# Patient Record
Sex: Male | Born: 1980 | Hispanic: Yes | Marital: Married
Health system: Western US, Academic
[De-identification: ages and names within clinical notes are randomized; demographics above are authoritative.]

---

## 2021-02-27 ENCOUNTER — Inpatient Hospital Stay: Admit: 2021-10-10 | Payer: BLUE CROSS/BLUE SHIELD | Primary: Physician

## 2021-04-06 ENCOUNTER — Inpatient Hospital Stay: Admit: 2021-10-10 | Payer: BLUE CROSS/BLUE SHIELD | Primary: Physician

## 2021-05-14 NOTE — Progress Notes (Unsigned)
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    Of note, patient saw his Dr. 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        Evaluation of a 41 y.o., {Right/left:16020}-handed male who presents for {bndiagnosis:39523}. The current symptoms first started ***/***/***. Date of initial diagnosis of Neuralgic Amyotrophy (Brachial Neuritis )***/***/***. Time from symptom onset to the date of Neuralgic Amyotrophy (Brachial Neuritis) diagnosis: ***days/weeks/months. Age at diagnosis: ***. The first symptoms were {BN First Symptom:39514}. At the onset of symptoms, the limbs involved were {BNLimb involvement:39524}. Antecedent events include {BNantecedent events:39525}. Time of day of onset was: {BN time of onse:41269}. At maximal deficit it involved {BNLimb involvement:39524}. Maximal deficit {BN max symptom:41762} was achieved in {BN max def OVFI:43329}. Motor symptoms included ***. Sensory symptoms included ***. Respiratory symptoms include: {BN respiratory sx:39896}. The pain began to improve in {BN max def JJOA:41660}. Current pain locations are: {BN location UE:39676}. The patient's current pain score is {pain score:304107553} and is located at the {BN Pain Location:39516}. Pain range in the last week: {BN pain range YTKZ:60109}.    The weakness began to improve in {BN max def NATF:57322}.The patient achieved full/partial*** recovery*** within {BN recovery time:39674}. The patient was ***able/unable to work for [employer] recovery [id number]. The patient's occupation is ***. The patient was able to work at ***%.    Previous history of brachial neuritis:  The first diagnosis of neuralgic amyotrophy (brachial neuritis) was made ***/***/***. This has occurred *** time(s). Time  from symptom onset to the date of Neuralgic Amyotrophy (Brachial Neuritis) diagnosis was: {BN time of onse:41269}. The age at diagnosis: ***. The first symptoms were {BN First Symptom:39514}. At the onset of symptoms, the limbs involved were {BNLimb involvement:39524}. Antecedent events include {BNantecedent events:39525}. Time of day of onset was: {BN time of onse:41269}. At maximal deficit it involved {BNLimb involvement:39524}. Maximal deficit {BN max symptom:41762} was achieved in {BN max def GURK:27062}. Motor symptoms included ***. Sensory symptoms included ***. Respiratory symptoms included: {BN respiratory sx:39896}. The pain began to improve in {BN max def BJSE:83151}. The weakness began to improve in {BN max def VOHY:07371}.The patient achieved full/partial*** recovery*** within {BN recovery time:39674}. The patient was ***able/unable to work for [employer] [id number]. The patient's occupation was ***. The patient was able to work at ***%.    The patient has seen *** clinicians for these symptoms. Specialty include: ***. Diagnoses given prior to the diagnosis of neuralgic amyotrophy (brachial neuritis) include: injury of brachial plexus, cervicalgia, other muscle spasm. The time from symptom onset to physical therapy: ***. It was completed: {Yes or No:22831}. It was helpful: {Yes or No:22831}. Time from symptom onset to occupational therapy: ***. It was completed: {Yes or No:22831}. It was helpful: {Yes or No:22831}.    Family History of neuralgic amyotrophy (brachial neuritis)?{BN GGYI:94854}    AN (BN )Treatment Medication Name   Start (MM/DD/YYYY, MM/YYYY, YYYY, Unknown) End (MM/DD/YYYY, MM/YYYY, YYYY, Unknown) Ongoing          []          []        []       MEDICATIONS      Review of Systems:    Constitutional: {Findings; ROS constitutional:30497::"negative"}  Eyes: {Findings;  ROS eyes:30500::"negative"}  Ears, nose, mouth, throat, and face: {Findings; ROS HEENT:30502::"negative"}  Respiratory:  {Findings; ROS respiratory:30504::"negative"}  Cardiovascular: {Findings; ROS cardiac:30506::"negative"}  Gastrointestinal: {Findings; ROS gastrointestinal:30513::"negative"}  Genitourinary: {Findings; ROS genitourinary:30516::"negative"}  Hematologic/lymphatic: {Findings; ROS hematologic/lymphatic:30522::"negative"}  Musculoskeletal: {Findings; ROS musculoskeletal:30524::"negative"}  Neurological: {Findings; ROS neuro:30532::"negative"}  Behavioral/Psych: {Findings; ROS psychiatric:30534::"negative"}  Endocrine: {Findings; ROS endocrine:30536::"negative"}  Allergic/Immunologic: {Findings; ROS allergic/immunologic:30539::"negative"}  ***     Medical and Surgical History:   Past Medical History:   Diagnosis Date    Hip pain        Past Surgical History:   Procedure Laterality Date    2 knee surgeries  2012,2009       Allergies:   Allergies/Contraindications  No Known Allergies    Social History:   Social History     Socioeconomic History    Marital status: Married     Spouse name: Not on file    Number of children: Not on file    Years of education: Not on file    Highest education level: Not on file   Occupational History    Not on file   Tobacco Use    Smoking status: Never    Smokeless tobacco: Never   Substance and Sexual Activity    Alcohol use: No    Drug use: Yes     Types: Anabolic steroids    Sexual activity: Not on file   Other Topics Concern    Not on file   Social History Narrative    Not on file     Social Determinants of Health     Financial Resource Strain: Not on file   Food Insecurity: Not on file   Transportation Needs: Not on file       Family History: No family history on file.        Objective            Lungs:  Clear to auscultation  Cardiac:  Regular rate and rhythm without gallops, rubs or murmurs.  Distal pulses normal  Musculoskeletal:  Normal range of motion of neck, shoulders, elbows, wrists, hips, knees and ankles.      NEUROLOGICAL EXAM    Lungs:  Clear to  auscultation  Cardiac:  Regular rate and rhythm without gallops, rubs or murmurs.  Distal pulses normal  Musculoskeletal:  Normal range of motion of neck, shoulders, elbows, wrists, hips, knees and ankles.    Mental Status:  The patient is alert and oriented X 3, is a clear historian and is fluent with normal comprehension.    Cranial Nerves:   Right Left   EOM Full Full   Fundus Normal with sharp disc Normal with sharp disc   Visual fields Full to confrontation Full to confrontation   Pupils PERRL PERRL   Ptosis No No   Nystagmus No No   Facial sensation to light touch V1, V2, V3 Normal Normal   Facial nerve       Frontalis 5/5 5/5     Oribcularis oculi 5/5 5/5     Orbicularis oris 5/5 5/5   Hearing to finger rub Normal Normal   Speech Normal Normal    Ma Normal Normal    La Normal Normal    Ga Normal Normal   SCM 5/5 5/5   Tongue       Strength 5/5 5/5     AMR Normal Normal     Bulk Normal Normal    Fasciculations None None     Muscle atrophy: {  BN atrophy:39702}  Muscle Weakness: {BN weakness:39703}    Motor Strength:  Muscle Deltoid Biceps Triceps Infra-  spin Supra-  spin Pectoralis   Right 5 5 5 5 5 5    Left 5 5 5 5 5 5     Wrist extend FCR FCU Pron T. Sup Subscap   Right 5 5 5 5 5 5    Left 5 5 5 5 5 5     Finger extend EIP FDP 2/3 FDP 4/5 Brachio-  radialis Trapezius   Right 5 5 5 5 5 5    Left 5 5 5 5 5 5     ADM FDI APB FPL Pron Q. Rhomboid   Right 5 5 5 5 5 5    Left 5 5 5 5 5 5     Ilio-  psoas Gluteus max Quad Ham     Right 5 5 5 5      Left 5 5 5 5       Dorsi-  flex Plantar-  flex Toe ext Toe flex Inversion Eversion   Right 5 5 5 5 5 5    Left 5 5 5 5 5 5       Scapular winging: {BN Scap wing:39704}   Scapular dyskinesia: {BN scap dys:39894}   Sensory loss: {BN sensory less:39705}     DTR:   Right Left   Jaw     Biceps 2 2   Triceps 2 2   Brachioradialis 2 2   Finger flexors     Knee 2 2   Int. Hamstring     Ankle 2 2   Babinski Normal Normal     Involvement of motor nerves: {BN nerves :39706}     Gait:   walks on toes and heels, normal base, stride and turn, normal tandem. Romberg negative.       Coordination:  normal finger and foot tap, finger-nose and heel-knee-shin.     Sensory examination:  Normal vibration, position sense and pinprick in the upper and lower extremities.            Review of Prior Testing        TESTS PERFORMED  1. No EMG performed  2. Chest x-ray: Yes  2.1. If yes, Hemidiaphragm: No , N/A  2.2. Date of x-ray: 02/27/2021    3. N/A  4. Nerve ultrasound No  If yes, part of body?  N/A  5. Has the patient had genetic testing?  No, genetic testing was not performed.   6. Other tests:   6.1. Xray of cervical spine: 02/27/2021  ?         PULMONARY FUNCTION TESTS  1. Was Pulmonary Function Testing performed since their last visit (including today's visit)? No     What type of pulmonary function testing has been performed since last visit?  N/A      Assessment and Plan         {Assessment and plan options:32133}        The patient will follow-up in the neurology clinic in *** months, but was encouraged to call or return to clinic sooner than scheduled if symptoms worsen or new symptoms develop.          {Complexity of data reviewed (optional):28233::" "}    I spent a total of *** minutes on this patient's care on the day of their visit excluding time spent related to any billed procedures. This time includes time spent with the patient as well as time spent documenting in the medical record, reviewing  patient's records and tests, obtaining history, placing orders, communicating with other healthcare professionals, counseling the patient, family, or caregiver, and/or care coordination for the diagnoses above.      I, Ellard Artis am acting as a Neurosurgeon for services provided by Riley Lam. Poncelet, MD on 05/07/21  8:01 PM.    I, Cristel Allen am acting as a Neurosurgeon for services provided by Riley Lam. Poncelet, MD on 05/12/21  3:07 PM.    ***

## 2021-10-08 ENCOUNTER — Ambulatory Visit: Admit: 2021-10-08 | Discharge: 2021-10-08 | Payer: 59 | Attending: Neurology | Primary: Physician

## 2021-10-08 DIAGNOSIS — G545 Neuralgic amyotrophy: Secondary | ICD-10-CM

## 2021-10-08 MED ORDER — ALBUTEROL SULFATE HFA 90 MCG/ACTUATION AEROSOL INHALER
90 | RESPIRATORY_TRACT | 2.00 refills | 25.00000 days | Status: AC | PRN
Start: 2021-10-08 — End: ?

## 2021-10-08 NOTE — Patient Instructions (Addendum)
Thank you for visiting the [facility]. You were seen by Dr. Pearline Cables.    The diagnosis discussed today: Neuralgic amyotrophy of right brachial plexus, and Scapular dyskinesis    Diagnostic testing discussed today: - MR neurogram - To schedule your exam, call [facility] Scheduling [phone].    You can access your results via MyChart. However, if you would like to discuss results with me, please let me know via MyChart or telephone.  MyChart messages sent during business hours (8am - 5pm) W, Th or Fri should be responded to within 24 - 72 hours. After hours messages over the weekend messages will likely receive a response the following Monday.     The my recommendations for you today:   - [facility]erral to [facility]  - Referral to physical therapy - Kizzie Fantasia, Charlsie Quest or [facility] Palms- To schedule physical therapy, please call [phone]  - Consider EMG/NCS    How do I contact my  neurologist, Dr. Pearline Cables?    For appointments and updates regarding future tests and referrals:    Send a MyChart message  Call the clinic at [phone]  Fax [phone]  Mailing address: [address], [address], [place], [place] [address]    For non-urgent questions:    Send a message through the MyChart system, OR  Call the clinic at [phone]    For urgent matters:    Call our hospital at [phone] and ask the operator to page the neurology resident on-call.    For emergencies:    If you cannot get a quick response, please call 911 or go to your local emergency room.
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
## 2021-12-31 ENCOUNTER — Ambulatory Visit: Payer: PRIVATE HEALTH INSURANCE | Attending: Neurology | Primary: Physician

## 2022-02-04 ENCOUNTER — Ambulatory Visit: Admit: 2022-02-04 | Payer: 59 | Attending: Neurology | Primary: Physician

## 2022-02-04 DIAGNOSIS — G545 Neuralgic amyotrophy: Secondary | ICD-10-CM
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
## 2022-02-04 NOTE — Telephone Encounter (Signed)
CIS20R Fatigue Scale  Higher scores correlate with greater fatigue/difficulty on all measures    Total Score: 52  Score range: 20 - 140    Sub-scales  1. Subjective feeling of fatigue - 23  Score range: 8 - 56  2. Concentration - 16  Score range: 5 - 35   3. Motivation  - 8  Score range: 4 - 28  4. Physical activity - 5  Score range: 3 - 21    Dominant Arm: R  For which shoulder(s) have you been evaluated or treated? R    Considering all the ways that your shoulder affects you, mark X on the scale below for how well you are doing.    0 - 20 7.5    During the past month, how would you describe the usual pain in your shoulder at rest?  Very severe  Severe  Moderate  Mild  None  During the past month, how would you describe the usual pain in your shoulder during activities?  Very severe  Severe  Moderate  Mild  None  4.  During the past month, how often did the pain in your shoulder make it difficult for you to sleep at night?   Every day  Several days per week  One day per week  Less than one day per week  Never  5. During the past month how often have you had severe pain in your shoulder?  Every day  Several days per week  One day per week  Less than one day per week  Never  6. Considering all the ways you use your shoulder during daily, personal and household activities, (ie dressing, washing, driving, house chores, etc.) how would you describe your ability to use your shoulder?  Very severe limitation/unable  Severe limitation  Moderate limitation  Mild limitation  No limitation    During the past month, how much difficulty have you had in each of the following activities due to your shoulder.    7. Putting on or removing a pullover sweater or shirt.  Unable  Severe difficulty  Moderate difficulty  Mild difficulty  No difficulty  8. Combing or brushing your hair  Unable  Severe difficulty  Moderate difficulty  Mild difficulty  No difficulty  9. Reaching shelves that are above your head  Unable  Severe  difficulty  Moderate difficulty  Mild difficulty  No difficulty  10. Scratching or washing your low back with your hand.  Unable  Severe difficulty  Moderate difficulty  Mild difficulty  No difficulty  11. Lifting or carrying a full bag of groceries (8 to 10 pounds)  Unable  Severe difficulty  Moderate difficulty  Mild difficulty  No difficulty    The following questions refer to athletic or recreational activities    12. Considering all the ways you use your shoulder during athletic or recreational activities (ie baseball, golf, aerobics, gardening, etc.) how would you describe the function of your shoulder?  Very severe limitation/unable  Severe limitation  Moderate limitation  Mild limitation  No limitation  13. During the past month, how much difficulty have you had throwing a ball overhand or serving in tennis due to your shoulder.  Unable  Severe difficulty  Moderate difficulty  Mild difficulty  No difficulty  14. List one activity (recreational or athletic) that you particularly enjoy, then select the degree of limitation you have, if any, due to your shoulder.         Activity:  [employer], wrestling (BJJ), and weight lifting_________  Unable  Severe difficulty  Moderate difficulty  Mild difficulty  No difficulty  The following questions refer to work.  15. During the past month what has been your main form of work?  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 During the past month how often were you unable to do any of your usual housework because of your shoulder?  Every day  Several days per week  One day per week  Less than one day per week  Never  17. During the past month on the days that you did work, how often were you unable to do your work as carefully or as efficiently as you would like?  Every day  Several days per week  One day per week  Less than one day per  week  Never  18. During the past month, on the days that you did work, how often did you have to work a shorter day because of your shoulder?  Every day  Several days per week  One day per week  Less than one day per week  Never  19. During the past month, on the days that you did work, how often did you have to change the way that your usual work is done because of your shoulder?  Every day  Several days per week  One day per week  Less than one day per week  Never    The following questions refer to satisfaction and areas for improvement.    20. During the past month, how would you rate your overall degree of satisfaction with your shoulder?  Poor  Fair  Good  Very good  Excellent  Please rank the two areas in which you would most like to see improvement (place a "1" for the most important a "2" for the second most important)  Pain 1  Daily personal and household activities 2  Recreational or athletic activities 1  Work  1    QuickDASH  Each item is a Likert scale from 1-5. Larger scores indicate greater difficulty.    Ability to do the following activities in the last week    1. Open a tight or new jar. - 4  2. Do heavy household chores (e.g., wash walls, floors). - 2  3. Carry a shopping bag or briefcase. - 2  4. Wash your back. - 1  5. Use a knife to cut food. - 2  6. Recreational activities in which you take some force or impact through your arm, shoulder or hand (e.g., golf, hammering, tennis, etc.). - 4      7. During the past week, to what extent has your arm, shoulder or hand problem interfered with your normal social activities with family, friends, neighbours or groups? - 4    8. During the past week, were you limited in your work or other regular daily activities as a result of your arm, shoulder or hand problem? - 4    Please rate the severity of the following symptoms in the last week  9. Arm, shoulder or hand pain - 3  10. Tingling (pins and needles) in your arm, shoulder or hand - 4    11. During  the past week, how much difficulty have you had sleeping because of the pain in your arm,  shoulder or hand? - 3    QuickDASH DISABILITY/SYMPTOM SCORE: 33  Total score out of 100 - larger score indicates larger disability    Work Module  The following questions ask about the impact of your arm, shoulder or hand problem on your ability to work (including  homemaking if that is your main work role).  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 using your usual technique for your work? - 4  2. doing your usual work because of arm,  shoulder or hand pain? - 4  3. doing your work as well as you would like? - 4  4. spending your usual amount of time doing your work? - 4    Work Score: 16  Total score out of 100 - larger score indicates larger disability    Sports/Performing Arts Module  The following questions relate to the impact of your arm, shoulder or hand problem on playing your musical instrument or  sport or both. If you play more than one sport or instrument (or play both), please answer with respect to that activity which is  most important to you.  Please indicate the sport or instrument which is most important to you:____BJJ / wrestling__________    1. using your usual technique for playing your instrument or sport? - 4  2. playing your musical instrument or sport because of arm, shoulder or hand pain? - 4  3. playing your musical instrument or sport  as well as you would like? - 4  4. spending your usual amount of time  practising or playing your instrument or sport? - 4    Sports/Performing Arts Score: 16  Total score out of 100 - larger score indicates larger disability     Consent: Consented to [facility] registry and NID study, no photo or videos were taken per request of Dr. Jeannene Patella

## 2022-02-04 NOTE — Progress Notes (Signed)
PT Consultation at the [facility]    S:   Chief Complaint:  R UE weakness  HPI: First injured in 2019 during exposition, resulted in whole R upper quadrant weakness, atrophy and paresthesia, resolved for the most part by working with trainer;  then in 12/2020, had sudden onset of neck and arm pain and increase in weakness in shoulder and bicep/triceps.  Since that time has continued neck, R lateral neck pain that radiates to the lateral forearm on the R, as well as continued with weakness of the R shoulder and arm that also includes R grip strength.  His main concern is his strength but also notes that pain in the neck is worsened by driving, sitting on bleachers and chin tuck.  Has not been seeing PT this most recent episode as he feels that they have not yet been helpful.    Functional limitations:  Not able to use R shoulder, arm, and wrist for exercises, limited in BJJ practice by R UE weakness  Occupation/work status:  Works as Buyer, retail in [employer]  Activities/exercise:  [employer], training for [employer], aerobic ex  Questions/concerns: Can he continue with exercise training and BJJ practice and competitions;  what treatments are contraindicated and which treatments are indicated for best results;  what other tests need to be done?    O:  Posture: Forward head, minimal ant tipping  Scapulohumeral rhythm: WFL  AROM / PROM:  Shoulder flexion and abd 170, ER 80, IR HBB wrist to L1  Strength:  RTC - 4-/5, Serratus and Mid and lower trap 3+/5;  bicep and triceps 4-/5, grip 3+/5    Treatment:  1.  Instruction in modification of current exercises to address RTC, bicep, tricep and grip strength without compensatory motions  2.  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  Problems include mechanical neck and shoulder pain, decreased strength in upper quarter.    P:   PT:  Pt will reach out to local PT if indicated to address neck pain; will focus on neuromusc re-education for BN prescribed today.  Equipment recommendations: None

## 2022-02-04 NOTE — Progress Notes (Signed)
Respiratory Care Note    [facility]. DOB 05-Dec-1980 was seen for team clinic today.      Vitals   HR: 73, SPO2: 98% unlabored, RR: 16  Breathing  Unlabored   Cough  Strong cough   Weight  Stable weight   Sleep  No issues with sleeping      ESS Score:  0    Respiratory Measurements     MIP   >-60                      MEP >+60     FVC 97% 5.38 L                                        Good effort with reproducibility? Yes    DME provider- no respiratory equipment      Recommendations  No respiratory recommendations

## 2022-02-04 NOTE — Patient Instructions (Signed)
Please call radiology to arrange your MR neurogram and MRI of the cervical spine.       Radiology Central Scheduling Telephone:  [phone]

## 2022-04-05 ENCOUNTER — Inpatient Hospital Stay: Admit: 2022-04-05 | Payer: PRIVATE HEALTH INSURANCE | Primary: Physician

## 2022-04-05 VITALS — Ht 73.0 in | Wt 213.0 lb

## 2022-04-05 DIAGNOSIS — G545 Neuralgic amyotrophy: Secondary | ICD-10-CM

## 2022-04-05 MED ORDER — GADOTERATE MEGLUMINE 0.5 MMOL/ML (376.9 MG/ML) INTRAVENOUS SOLUTION
0.5 | Freq: Once | INTRAVENOUS | Status: AC
Start: 2022-04-05 — End: 2022-04-05
  Administered 2022-04-05: 19:00:00 via INTRAVENOUS

## 2022-04-09 ENCOUNTER — Ambulatory Visit: Admit: 2022-04-09 | Discharge: 2022-04-30 | Payer: 59 | Attending: Neurology | Primary: Physician

## 2022-04-09 DIAGNOSIS — G545 Neuralgic amyotrophy: Secondary | ICD-10-CM

## 2022-04-09 MED ORDER — TOBRAMYCIN 0.3 %-DEXAMETHASONE 0.1 % EYE DROPS,SUSPENSION
0.3-0.1 % | Freq: Four times a day (QID) | OPHTHALMIC | Status: AC | PRN
Start: 2022-04-09 — End: ?

## 2022-04-09 NOTE — Progress Notes (Signed)
Subjective    Terry Donaldson. is a 42 y.o. 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 He believes his episode of brachial neuritis was trigger by the use of power tools and the vibration associated with them. Then in ~November 2023, his fence broke and he used power tools to fix it. This took several days, and over the course of the time working on it he had worsening of his right hand weakness. Even after finishing the job, his weakness continue to remain below his baseline, but in the several months since then he feels that his hand strength is now nearly at his baseline. The pain in his neck and arm, which is variable at baseline, was worse over this same time. He had a single injection of steroids in [place] within two weeks of working on the fence which was helpful for the pain only. He has not had any and left sided symptoms. No SOB. He has continued to compete in [place] jujitsu over this time period.         Objective      Vitals      Flowsheet Row Most Recent Value   BP 133/75   Heart Rate 86   *Resp 18   Temp 36.3 C (97.3 F)   SpO2 96 %   Weight 101.2 kg (223 lb)   Height 185.4 cm ('6\' 1"'$ )   Pain Score 2   Pain Loc NECK   BMI (Calculated) 29.5          Physical Exam     Exam: VFFTC, EOMI, Face symmetric, tongue midline. No PD. Strength (R/L) is Delt 4/5, Bi 4/5, Tri 4/5, WE 4/5, FE 4/5, FF 4/5, IP 5/5, Quad 5/5, Ham 5/5, TA 5/5, Gastroc 5/5. Reflexes are (R/L) AJ 2+/2+, Patella 1+/2+, Biceps 1+/1+, Tri 1+/1+, BR 1+/1+. LT is normal in the LUE. In the RUE, there is abnormal LT in the entirety of the right D1 and on the lateral, dorsal, and palmar surface of D2, as well as in a patch on the proximal lateral  forearm.     Review of Prior Testing        Assessment and Plan         Assessment:  42 year old M with history of stretch injury of the brachial plexus in 2019  with new right arm weakness, paresthesias and pain that began in November 2022. Since his last visit with Dr. Ouida Sills in August 2023, he has worsening right hand weakness and neck pain. Neurologic examination is significant for decreased pinprick on right upper and lower lateral arm and weakness of right deltoid, infraspinatus, pectoralis major, biceps brachii, triceps brachii, Extensor indicis proprius, Finger extensors, Flexor carpi radialis, Flexor carpi ulnaris, Flexor digitorum profundus digits I and II , Flexor digitorum profundus digits III and IV, Pronator, Supinator, Wrist extensors, Abductor pollicis brevis, Abductor digiti minimi, and First dorsal interosseus. The worsening hand weakness and neck pain is not consistent with brachial neuritis. A superimposed C8/T1 radiculopathy is a possibility.  Query:  brachial plexopathy, focal neuropathies,  cervical radiculopathy.    Plan: based on the referral, history and physical examination, the EMG/NCS protocol will be bilateral median, ulnar, radial, lateral and medial antibrachial sensory, right median and ulnar motor with F.  EMG will be of the right upper extremity.          The patient will follow-up with the referring provider Terry Mccoy, MD              Terry Donaldson. Terry Meche, MD

## 2022-04-09 NOTE — Patient Instructions (Signed)
Your finalized EMG/NCS report will be sent to your referring provider, Eliezer Mccoy.  Please follow up with them to review the results and determine the next steps in your care.

## 2022-04-09 NOTE — Procedures (Signed)
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 He believes his episode of brachial neuritis was trigger by the use of power tools and the vibration associated with them. Then in ~November 2023, his fence broke and he used power tools to fix it. This took several days, and over the course of the time working on it he had worsening of his right hand weakness. Even after finishing the job, his weakness continue to remain below his baseline, but in the several months since then he feels that his hand strength is now nearly at his baseline. The pain in his neck and arm, which is variable at baseline, was worse over this same time. He had a single injection of steroids in [place] within two weeks of working on the fence which was helpful for the pain only. He has not had any and left sided symptoms. No SOB. He has continued to compete in [place] jujitsu over this time period. Exam: VFFTC, EOMI, Face symmetric, tongue midline. No PD. Strength (R/L) is Delt 4/5, Bi 4/5, Tri 4/5, WE 4/5, FE 4/5, FF 4/5, IP 5/5, Quad 5/5, Ham 5/5, TA 5/5, Gastroc 5/5. Reflexes are (R/L) AJ 2+/2+, Patella 1+/2+, Biceps 1+/1+, Tri 1+/1+, BR 1+/1+. LT is normal in the LUE. In the RUE, there is abnormal LT in the entirety of the right D1 and on the lateral, dorsal, and palmar surface of D2, as well as in a  patch on the proximal lateral forearm. Query brachial plexus injury.     Procedure: Right median and ulnar motor nerve conduction studies, and bilateral median, ulnar, radial, medial antebrachial cutaneous, and lateral antebrachial cutaneous sensory nerve conduction studies were performed with surface electrodes. EMG studies of the right arm and infraspinatus were performed with concentric needle electrodes.    Description:  The results of the nerve conduction studies are abnormal for reduced amplitude of the sensory nerve action potentials (SNAPs) in the bilateral superficial radial sensory nerves.        The results of the EMG studies are abnormal for: 1) fibrillation potentials in the right triceps, biceps, and pronator teres, and 2) an increased incidence of long duration motor unit action potentials (MUAPs) as well as reduced recruitment of MUAPs in the right deltoid, biceps, triceps, pronator teres, EDC, APB, FDI, and infraspinatus.            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 See comment.     COMMENT: A concurrent right subacute C6/C7 radiculopathy cannot be excluded by these studies. Recommend correlation with the patient's MR images of the cervical spine. The abnormal superficial radial sensory response  on the left may suggest the left plexus is also affected.  It could also reflect a superimposed superficial radial sensory neuropathy which can occur in martial arts with repeated hand grips to the wrist.  Clinical correlation is advised.          Christella Hartigan, MD, Neuromuscular Fellow    I have seen and examined the patient and discussed the case with the fellow. The needle EMG exam was performed under my supervision. I have reviewed and agree with the findings and plan as documented above by Dr. Brendolyn Patty.    Electromyographer:       Gailen Shelter, MD  ([id number])           Normal Values      ANTIDROMIC SENSORY     MOTOR  Nerve Conduction      Amplitude AGE Nerve Dist Lat Conduction Velocity           Amplitude   Velocity (m/s) (m V) (years)  (ms) forearm/leg  elbow/knee upper arm (mV)  Median (W-D2) 39 12 41-60 Median (APB) 4.5 44 - 50           4.8  Ulnar (W-D5) 41 10 41-60 Ulnar (ADM)  3.9 44 40 40           4.5  Sural 36 5 41-60 Tibial (AH) 7.0 36 - -              2.0  Superf Peroneal 36 3 41-60 Peroneal (EDB) 6.5 36 34 -              1.5  Lower limits for nerve conduction studies set at 33  C for arm and 32  C for leg.    [place]      Nerve / Sites Rec. Site Onset Lat Peak Lat NP Amp Segments Distance Peak Diff Velocity Temp. Stim. Dur     ms ms V  mm ms m/s C ms   L Median - Digit II (Antidromic)      Wrist Dig II 2.7 3.4 31.2 Wrist - Dig II 140  53 32.4 0.1   R Median - Digit II (Antidromic)      Wrist Dig II 2.8 3.5 38.3 Wrist - Dig II 140  51 32.7 0.1   L Ulnar - Digit V (Antidromic) 140 mm      Wrist Dig V 2.5 3.1 25.4 Wrist - Dig V 140  57 32.6 0.1        A.Elbow - Wrist        R Ulnar - Digit V (Antidromic) 140 mm      Wrist Dig V 2.4 3.3 22.4 Wrist - Dig V 140  59 31.3 0.1        A.Elbow - Wrist        L Radial - Anatomical snuff box (Forearm) 100 mm      Forearm Wrist 1.8 2.4 7.6 Forearm - Wrist 100  55 31.9 0.1   R Radial - Anatomical snuff box (Forearm) 100 mm      Forearm Wrist 1.5 2.1 15.8 Forearm - Wrist 100  66 32 0.1   R Radial - Anatomical snuff box (Forearm) 100 mm      Forearm Wrist 1.6 2.3 16.5 Forearm - Wrist 100  62 28.5 0.1   R Lateral antebrachial cutaneous - Forearm (Elbow)      Elbow Forearm 2.0 2.6 17.6 Elbow - Forearm 120  61 32 0.1   L Lateral antebrachial  cutaneous - Forearm (Elbow)      Elbow Forearm 1.9 2.7 20.7 Elbow - Forearm 120 0.0 58 33.4 0.1   R Medial antebrachial cutaneous - Forearm (Elbow)      Elbow Forearm 1.8 2.5 13.2 Elbow - Forearm 120  66 31.5 0.1   L Medial antebrachial cutaneous - Forearm (Elbow)      Elbow Forearm 1.7 2.6 17.4  Elbow - Forearm 120  70 33.5 0.1       MNC      Nerve / Sites Muscle Latency Amplitude Rel Amp Duration Area Segments Distance Lat Diff Velocity Temp.     ms mV % ms mVms  mm ms m/s C   R Median - APB      Wrist APB 3.8 12.8 100 7.4 44.1 Wrist - APB 80   31.7      Elbow APB 8.6 12.2 94.7 7.3 43.1 Elbow - Wrist 280 4.8 58 31.9   R Ulnar - ADM      Wrist ADM 2.8 11.9 100 5.6 38.8 Wrist - ADM 80   31.3      B.Elbow ADM 6.5 11.4 95.1 6.2 38.4 B.Elbow - Wrist 210 3.7 57 31.5      A.Elbow ADM 8.3 11.3 99.6 6.1 38.4 A.Elbow - B.Elbow 100 1.8 56 31.3       F  Wave      Nerve F Lat M Lat    ms ms   R Median - APB 29.7 3.9   R Ulnar - ADM 29.3 2.8       EMG Summary Table     Spontaneous Volitional MUAPs Max Vol Act -   Muscle Fib/PSW Fasc Dur. Amp Poly Recruit Interference Max Freq Comments   R. Deltoid None None 15-25 1.5-3.0 None Mod red Mod dec 40 Hz    R. Triceps brachii 2+ None 14-20 0.8-2.2 None Mod red Mod dec 40 Hz    R. Biceps brachii 2+ None 15-30 1.5-3.0 None Mod red Mod dec 40 Hz    R. Pronator teres 2+ None 14-20 1.0-2.0 None Sev red Sev dec 40 Hz    R. Extensor digitorum communis None None 14-20 1.0-2.0 None Mild red Mild 40 Hz    R. Abductor pollicis brevis None None 14-20 1.0-2.0 30% Mild red Mild 40 Hz    R. First dorsal interosseous None None 14-20 1.0-2.0 None Mild red Mild 40 Hz    R. Infraspinatus None None 15-30 1.5-3.0 None Mod red Mod dec 40 Hz

## 2022-04-15 ENCOUNTER — Ambulatory Visit: Admit: 2022-04-15 | Payer: 59 | Attending: Neurology | Primary: Physician

## 2022-04-15 DIAGNOSIS — G545 Neuralgic amyotrophy: Secondary | ICD-10-CM

## 2022-04-15 NOTE — Patient Instructions (Signed)
Thank you for visiting the [facility]. You were seen by Dr. Eliezer Mccoy.    The diagnosis discussed today: Neuralgic amyotrophy of right brachial plexus, Scapular dyskinesis, and Cervical radiculopathy    Diagnostic testing discussed today: N/A    You can access your results via MyChart. However, if you would like to discuss results with me, please let me know via MyChart or telephone.  MyChart messages sent during business hours (8am - 5pm) W, Th or Fri should be responded to within 24 - 72 hours. After hours messages over the weekend messages will likely receive a response the following Monday.     The my recommendations for you today:   - Referral to Neurosurgery  - Start physical therapy   - Agree with MR neurogram  - Review images at Peripheral Nerve Conference  - Monitor symptoms    How do I contact my [place] neurologist, Dr. Eliezer Mccoy?    For appointments and updates regarding future tests and referrals:    Send a MyChart message  Call the clinic at [phone]  Fax [phone]  Mailing address: [address], [place], [place]    For non-urgent questions:    Send a message through the [place] system, OR  Call the clinic at [phone]    For urgent matters:    Call our hospital at [phone] and ask the operator to page the neurology resident on-call.    For emergencies:    If you cannot get a quick response, please call 911 or go to your local emergency room.
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
## 2022-04-24 ENCOUNTER — Encounter: Admit: 2022-04-24 | Payer: PRIVATE HEALTH INSURANCE | Primary: Physician

## 2022-04-24 DIAGNOSIS — G545 Neuralgic amyotrophy: Secondary | ICD-10-CM

## 2022-04-24 NOTE — Patient Instructions (Signed)
Recommendations  Please perform the following Exercises, focusing on scapula (shoulder blade) control and neck stability. As a general rule you should not feel any of the tightness in the R chest and delt nor increase in tingling in the fingers during exercise, and should always keep your neck in the strongest, most stable position.    Examples we discussed today:.    Cable rows and pulldowns - focus on control of scapula with thoracic extension and stable position of the neck.  Resume mid and lower trap retraining exercises as shown below, also focusing on stabilizing the neck:                For now, focus on neck stabilization exercises with your head and neck in a neutral position using the devices you described;  we will progress to neck strengthening with movement after we re-establish a foundation of neck stability in neutral position  Continue to work on shoulder flexibility, again keeping your neck stabilized in a neutral position.

## 2022-04-24 NOTE — Progress Notes (Signed)
Terry Donaldson. was seen in the Outpatient Physical Therapy Faculty Practice at [facility] for an Initial Evaluation on 04/24/2022 for Neuralgic amyotrophy of right brachial plexus.    The patient is a 42 y.o. 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 The patient is primarily impaired by increased pain and decreased strength all of which limit his ability to perform the following activities: BADLS, lifting, reaching, recreational activity, and work as Art therapist .  The patient will benefit from skilled physical therapy in order to address the impairments listed above and reach the attached goals.  Personal and environmental factors include significant co-morbidities and history of competition in martial arts .    PLAN: We plan on following up with the patient 1x/month for 8 Month(s).  Treatment Interventions will include: Patient/Caregiver Education, Manual Therapy, Self Care, Therapeutic Exercise, Neuromuscular Re-education, Therapeutic/Functional Activities, Physical Tests & Measures.    Focus of next therapy session: Revisit neck stabilization and mobility training by video, check in with neurosurgery consultation findings    Flowsheet Row Most Recent Value   PT Short Term Goals    Goals Additional Goal 1, Additional Goal 2   PT Short Term Goal 1    Goal Description Indep with posture re-education and body mechanics for sx  management   Complete Within (visits) 4 visits   PT Short Term Goal 2    Goal Description Indep with activity modification and progression for sx management   Complete Within (visits) 4 visits          Flowsheet Row Most Recent Value   Long Term Goals    Add Goal HEP, LTG Other 1, LTG Other 2   HEP    Patient/Caregiver independent with home program to address functional goals below   Complete Within (visits) 8 visits   PT Long Term Goal 2    Patient will be able to Resume  upper body strength training without increase in neck and upper quarter sx   Complete Within (visits) 8 visits             The patients potential for achieving stated goals is: fair

## 2022-05-04 ENCOUNTER — Ambulatory Visit: Payer: PRIVATE HEALTH INSURANCE | Attending: Student in an Organized Health Care Education/Training Program

## 2022-05-04 ENCOUNTER — Inpatient Hospital Stay: Admit: 2022-05-04 | Discharge: 2022-05-18 | Payer: PRIVATE HEALTH INSURANCE | Primary: Physician

## 2022-05-04 DIAGNOSIS — G545 Neuralgic amyotrophy: Secondary | ICD-10-CM

## 2022-05-04 MED ORDER — DIAZEPAM 10 MG TABLET
10 | ORAL_TABLET | Freq: Once | ORAL | 0 refills | Status: AC | PRN
Start: 2022-05-04 — End: ?
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
## 2022-05-11 ENCOUNTER — Encounter: Admit: 2022-05-12 | Payer: PRIVATE HEALTH INSURANCE | Primary: Physician

## 2022-05-11 DIAGNOSIS — G545 Neuralgic amyotrophy: Secondary | ICD-10-CM

## 2022-05-11 NOTE — Progress Notes (Signed)
Follow up     Terry Donaldson. was seen in the Outpatient Physical Therapy Faculty Practice at [facility] for follow up on 05/11/2022 for Neuralgic amyotrophy of right brachial plexus.    I performed this consultation using real-time Telehealth tools, including a live video connection between my location and the patient's location. Prior to initiating the consultation, I obtained informed verbal consent to perform this consultation using Telehealth tools and answered all the questions about the Telehealth interaction.      The patient is a 42 y.o. 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 The patient is primarily impaired by increased pain and decreased strength all of which limit his ability to perform the following activities: BADLS, lifting, reaching, recreational activity, and work as Art therapist .  The patient will benefit from skilled physical therapy in order to address the impairments listed above and reach the attached goals.  Personal and environmental factors include significant co-morbidities and history of competition in martial arts .    PLAN: We plan on following up with the patient 1x/month for 8 Month(s).  Treatment Interventions will include: Patient/Caregiver Education, Manual Therapy, Self Care, Therapeutic Exercise, Neuromuscular Re-education, Therapeutic/Functional Activities, Physical Tests & Measures.    Focus of next therapy session: Revisit neck stabilization and mobility training, check in with neurosurgery consultation findings, re-assess strength measurements in person    Flowsheet Row Most Recent Value   PT Short Term Goals    Goals Additional Goal 1, Additional Goal 2   PT Short Term Goal 1    Goal Description Indep with posture re-education and body mechanics for sx  management   Complete Within (visits) 4 visits   Progress In progress   Goal Met? No   PT Short Term Goal 2    Goal  Description Indep with activity modification and progression for sx management   Complete Within (visits) 4 visits   Progress In progress   Goal Met? No          Flowsheet Row Most Recent Value   Long Term Goals    Add Goal HEP, LTG Other 1, LTG Other 2   HEP    Patient/Caregiver independent with home program to address functional goals below   Complete Within (visits) 8 visits   Progress In progress   Goal Met? No   PT Long Term Goal 1    Progress In progress   Goal Met? No   PT Long Term Goal 2    Patient will be able to Resume upper body strength training without increase in neck and upper quarter sx   Complete Within (visits) 8 visits   Progress In progress   Goal Met? No             The patients potential for achieving stated goals is: fair

## 2022-05-26 ENCOUNTER — Inpatient Hospital Stay: Admit: 2022-05-26 | Payer: PRIVATE HEALTH INSURANCE | Primary: Physician

## 2022-05-26 DIAGNOSIS — G545 Neuralgic amyotrophy: Secondary | ICD-10-CM

## 2022-05-26 MED ORDER — GADOTERATE MEGLUMINE 0.5 MMOL/ML (376.9 MG/ML) INTRAVENOUS SOLUTION
0.5 | Freq: Once | INTRAVENOUS | Status: AC
Start: 2022-05-26 — End: 2022-05-26
  Administered 2022-05-26: 18:00:00 20 mL/kg via INTRAVENOUS

## 2022-07-20 ENCOUNTER — Telehealth
Admit: 2022-07-20 | Discharge: 2022-07-20 | Payer: PRIVATE HEALTH INSURANCE | Attending: Student in an Organized Health Care Education/Training Program | Primary: Physician

## 2022-07-20 DIAGNOSIS — G545 Neuralgic amyotrophy: Secondary | ICD-10-CM

## 2022-07-20 NOTE — Progress Notes (Unsigned)
I saw Terry Donaldson. today at the [place] [facility].      APP Visit Information:     APP Service Type:  Shared      {Complexity of data reviewed (optional):28233}         Neurosurgery Follow Up     Subjective    [facility]. presents today following initial consultation with Dr. Loura Halt on 05/03/22:    Terry Donaldson. is a 42 y.o. 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 He recovered well. He has new unprovoked symptoms, which involve multiple muscle groups. He has a motion degraded MRI that shows a right sided C5-6 disc herniation, which is poorly visualized. I need to repeat this MRI.  I think he may have two different processes happening concurrently: natural progression of brachial plexopathy, superimposed on new C5-6 right eccentric nerve root compression.     I will repeat MRI, and speak to Dr Dareen Piano to determine what the next best step is. I did discuss three different surgical strategies, but I am not sure which one he would be a candidate for given poor quality images from motion degradation.     Recommendations for today:  MRI cervical without  Diazepam sent to local pharmacy for claustrophobia  Follow up w/ results of this study.     Interval History     Today patient reports that his right thumb numbness waxes and wanes. The pain level went down a little bit. All the other symptoms are the same.     Objective    Physical Exam  Awake, alert and oriented x3  Cranial nerves II-XII intact   No pronator drift  Stands erect with a normal gait  Able to stand on heels and toes  Motor strength 5/5 throughout   SILT bilat UE and UE     Imaging   MR NEUROGRAM CERVICAL SPINE/BRACHIAL PLEXUS W/WO CONTRAST:  05/26/2022 10:58 AM     INDICATION (as provided by referring clinician): right brachial neuritis     ADDITIONAL HISTORY: history of stretch injury of the brachial plexus in 2019  whofollows up forright arm weakness, paresthesias and pain likely from neuralgic amyotrophy of the right arm and possible superimposed cervical radiculopathy.      COMPARISON: MR cervical spine dated 04/05/2022.     TECHNIQUE: Multiple sequences targeted to the brachial plexus were acquired at 3.0 tesla.     MEDICATIONS:  Dotarem - 20 mL - Intravenous     FINDINGS:  Long segment of abnormal T2 signal and caliber of the right C6 and C7 nerve roots, superior cord, and posterior division (series 7 image 18 and image 16).  No evidence for denervation or atrophy. No suspicious enhancement to suggest neuritis.  No neuroma identified.     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  Multilevel degenerative disc disease of the cervical spine with multilevel moderate right neural foraminal narrowing, unchanged and better assessed on recent MRI cervical spine dated 04/05/2022.  Right greater than left degenerative changes at the glenohumeral joints.     IMPRESSION:      Long segment signal abnormality of the right C6 and C7 nerve roots extending to the superior cord and posterior division which may be posttraumatic and/or compressive in etiology.        Report dictated by: Lester Kinsman, MD, signed by:  Dossie Arbour, MD  Department of Radiology and Biomedical Imaging        Assessment and Plan       The Neurogram demonstrates increased signal uptake in both C6 and C7 nerve roots.  But etiology is not clear between postraumatic versus compressive.    He continues to have weakness in multiple muscle groups but his pain has improved.    I will present this at neuroradiology conference to reach consensus for diagnosis and/or treatment.    Plan:  Neuroradiology conference presentation  Video follow up with the patient to determine next steps    Physician Statement of Shared Services  This is a shared visit for services provided by me, Loura Halt, MD.  I performed a face-to-face  encounter with the patient and contributed to this note.     Attending Dr. Hessie Diener, MD spent *** minutes on this patient's care on the day of their visit excluding time spent related to any billed procedures. Georges Lynch, PA-C, spent a total of nonoverlapping *** minutes on this patient's care on the day of their visit excluding time spent related to any billed procedures. This time includes time spent with the patient as well as time spent documenting in the medical record, reviewing patient's records and tests, obtaining history, placing orders, communicating with other healthcare professionals, counseling the patient, family, or caregiver, and/or care coordination for the diagnoses above.

## 2022-09-09 NOTE — Telephone Encounter (Signed)
Ok to schedule.

## 2022-10-15 NOTE — Telephone Encounter (Signed)
This patient submitted an appointment request with clinical information. The clinical information can be found in the Comments section. Please assist as provider did not provide follow up disposition notes, thank you.    Mindi Curling  [facility]

## 2022-11-24 IMAGING — MR JOELHO D
4 of 7 series · 22 of 40 positions shown · non-contrast
Comparison: none

[Series 7: T1 · sagittal · right · 4.5mm · 0.39mm/px · 6 of 20 slices shown (1 of 2)]
[im 1/20]
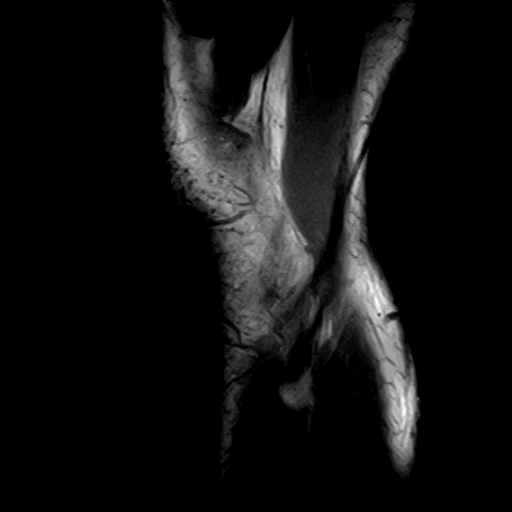
[im 4/20]
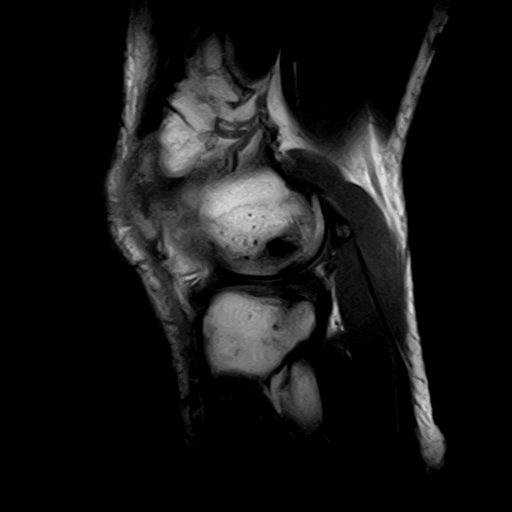
[im 8/20]
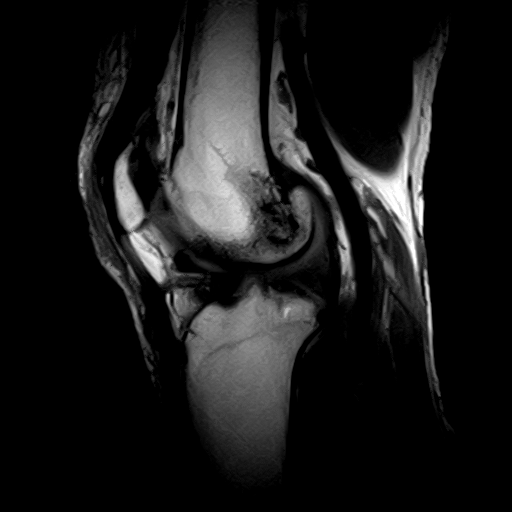
[im 12/20]
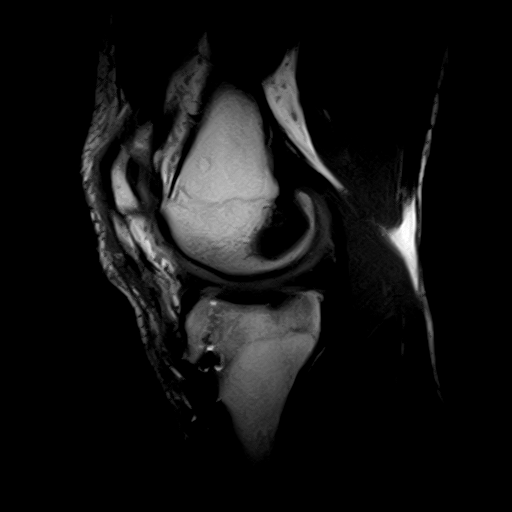
[im 16/20]
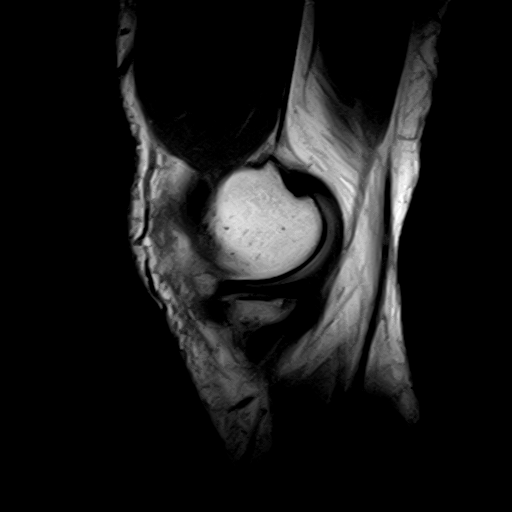
[im 20/20]
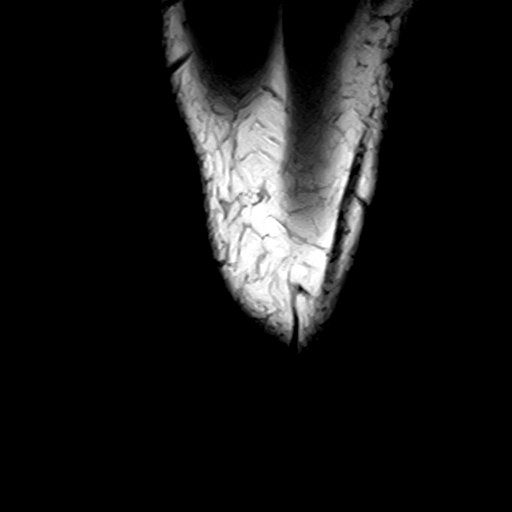

[Series 8: STIR · coronal · right · 4.0mm · 0.78mm/px · 5 of 18 slices shown (1 of 2)]
[im 1/18]
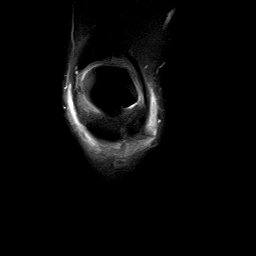
[im 5/18]
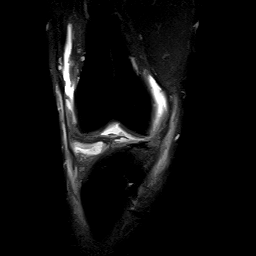
[im 9/18]
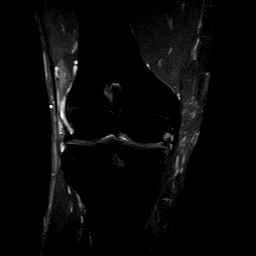
[im 13/18]
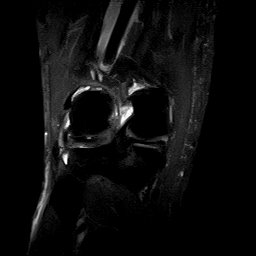
[im 18/18]
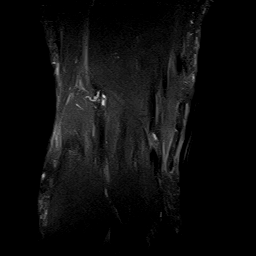

[Series 9: STIR · axial · right · 4.1mm · 0.39mm/px · z∈[-77,+13]mm · 5 of 22 slices shown (2 of 2)]
[im 1/22]
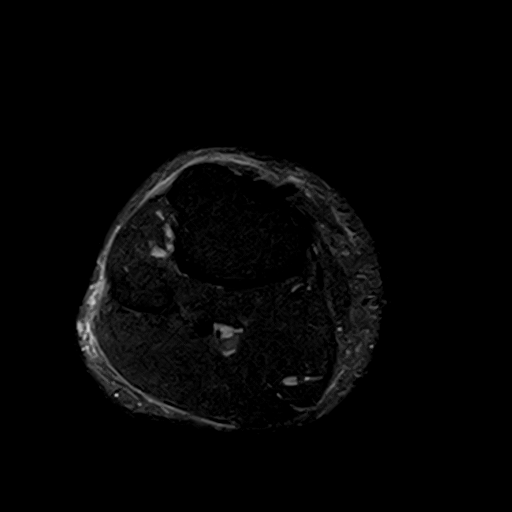
[im 4/22]
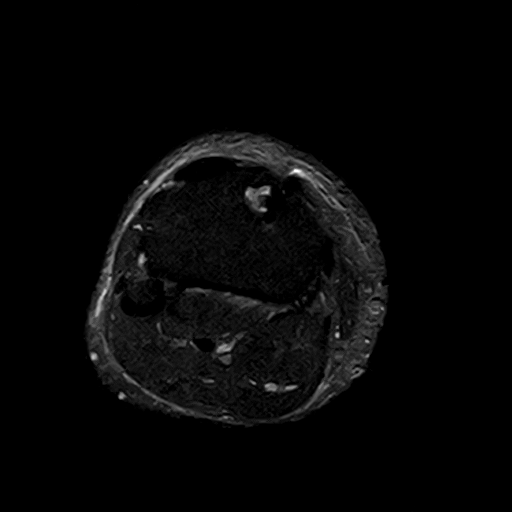
[im 8/22]
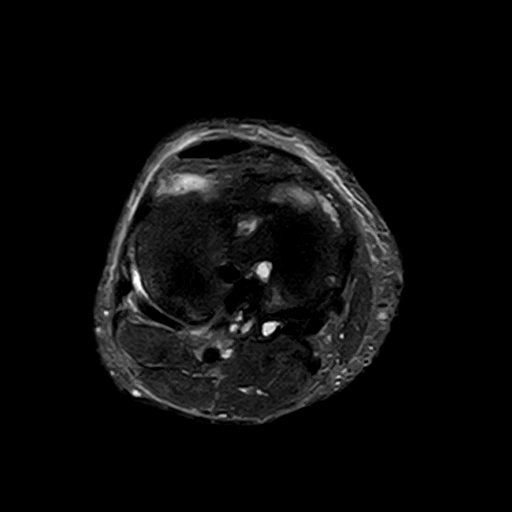
[im 11/22]
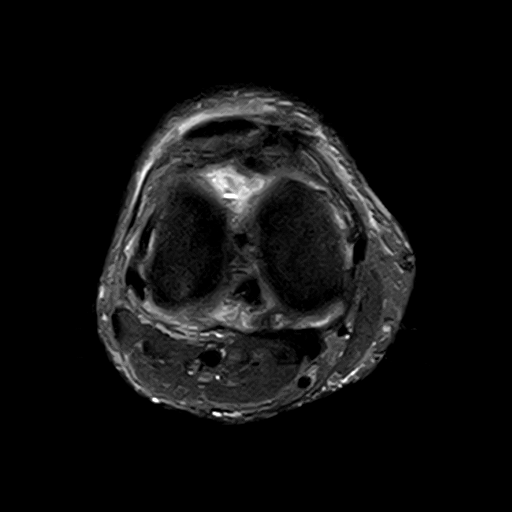
[im 18/22]
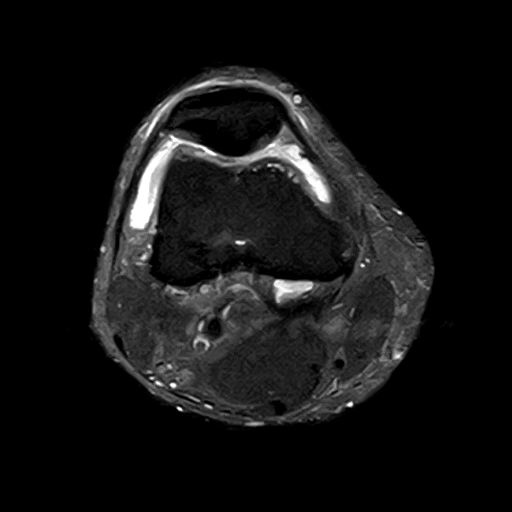

[Series 11: T1 · coronal · right · 4.0mm · 0.39mm/px · 6 of 20 slices shown (2 of 2)]
[im 1/20]
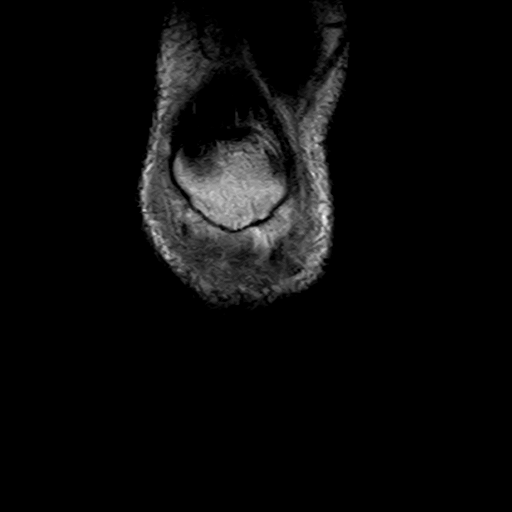
[im 4/20]
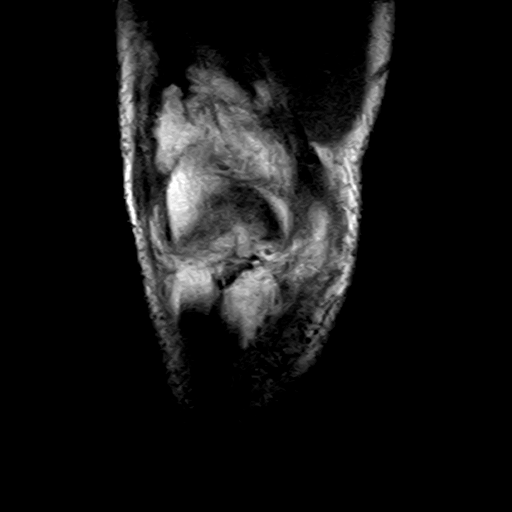
[im 8/20]
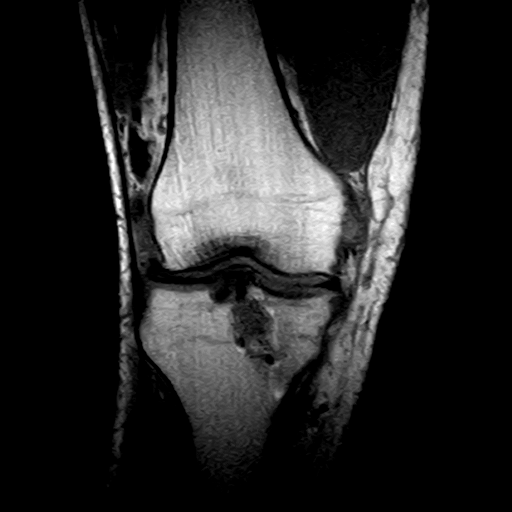
[im 12/20]
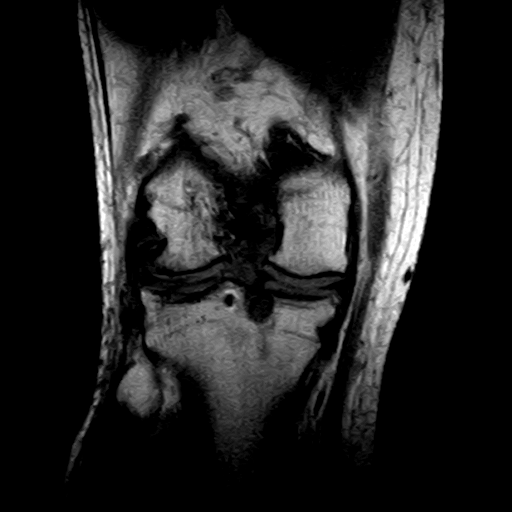
[im 16/20]
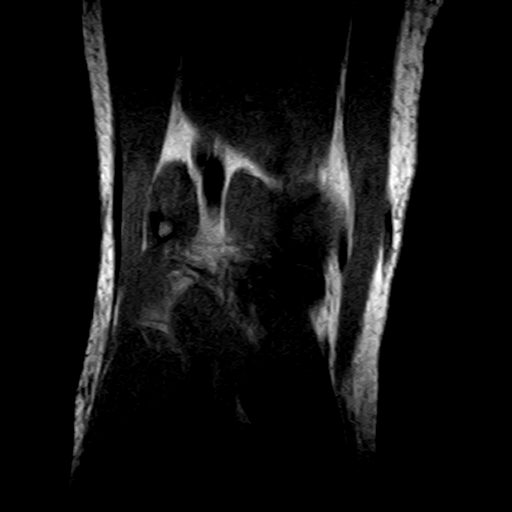
[im 20/20]
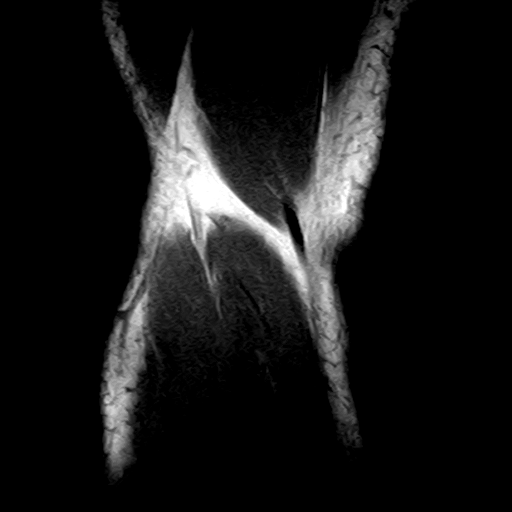

[22 of 40 positions shown; findings below may reference images not displayed]

RESSONÂNCIA MAGNÉTICA DO JOELHO DIREITO

TÉCNICA:

Exame realizado em equipamento de ressonância magnética com sequências, ponderações e planos específicos para o segmento de interesse, sem a administração endovenosa do meio de contraste.

RESULTADO:
Artropatia degenerativa tricompartimental do joelho caracterizada por afilamento com irregularidade condral além de osteófitos marginais com erosões condrais profundas e alguns focos de edema ósseo subcondrais.
Rotura oblíqua do corno posterior do menisco lateral com amputação da sua margem livre.
Rotura oblíqua da transição entre o corpo e o corno posterior do menisco medial com amputação das margens livres de ambos os segmentos além de extrusão articular parcial.
Reconstrução cirúrgica do ligamento cruzado anterior notando-se aspecto de rotura extensa do neoligamento.
Ligamento cruzado posterior e colaterais íntegros.
Tendão quadríceps e ligamento patelar sem alterações.
Pequena quantidade de líquido articular no joelho com aspecto de sinovite reacional.
Alterações fibrocicatriciais da gordura de Hoffa.
Ausência de cistos na fossa poplítea.

CONCLUSÃO:
Artropatia degenerativa tricompartimental do joelho com líquido articular.
Rotura oblíqua do corno posterior do menisco lateral com amputação da sua margem livre.
Rotura oblíqua da transição entre o corpo e o corno posterior do menisco medial com amputação das margens livres de ambos os segmentos além de extrusão articular parcial.
Reconstrução cirúrgica do ligamento cruzado anterior notando-se aspecto de rotura extensa do neoligamento.

## 2022-11-24 IMAGING — MR JOELHO E
4 of 7 series · 19 of 40 positions shown · non-contrast
Comparison: none

[Series 4: T1 · sagittal · left · 4.5mm · 0.39mm/px · 6 of 20 slices shown (1 of 2)]
[im 1/20]
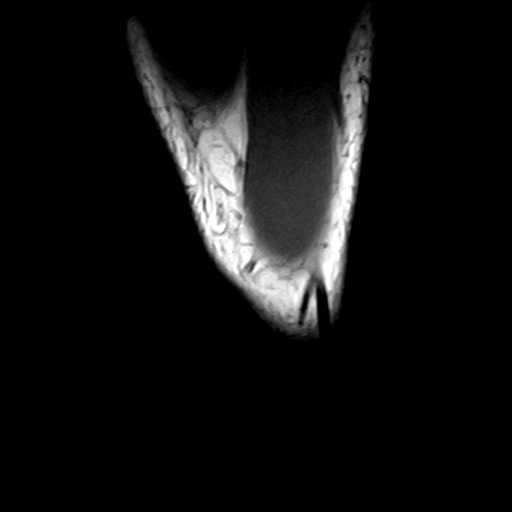
[im 4/20]
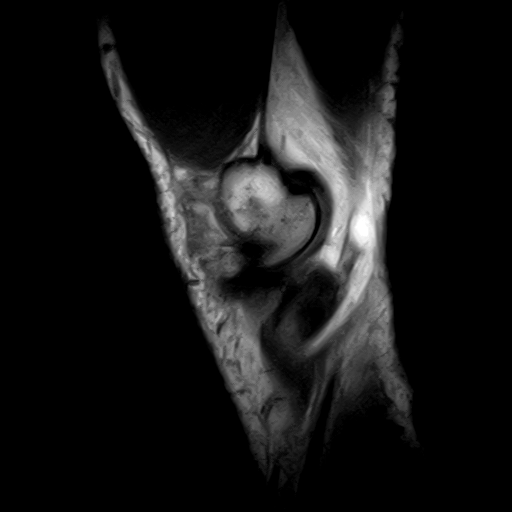
[im 8/20]
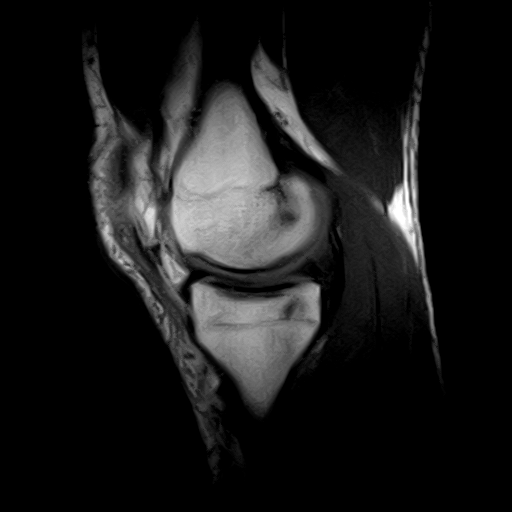
[im 12/20]
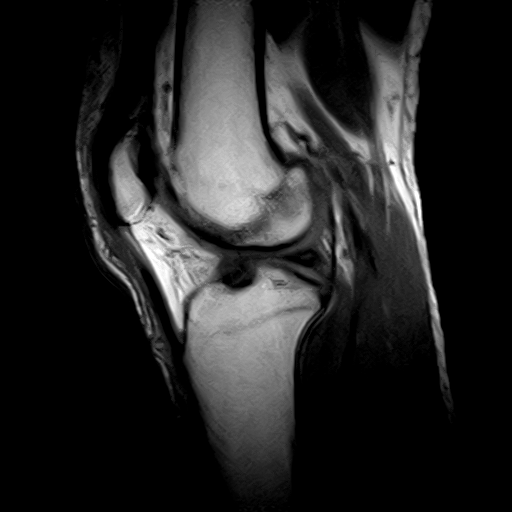
[im 16/20]
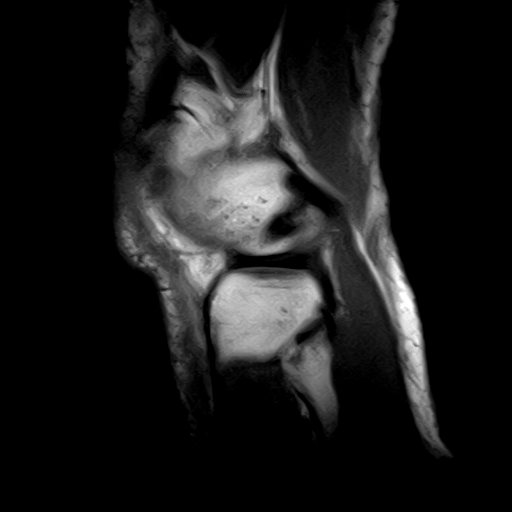
[im 20/20]
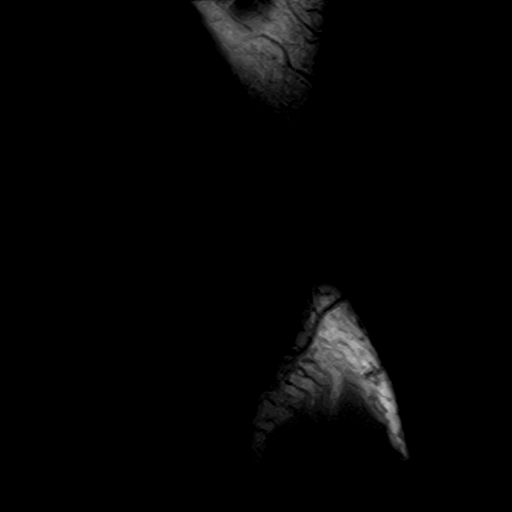

[Series 7: STIR · sagittal · left · 4.5mm · 0.39mm/px · 4 of 19 slices shown]
[im 1/19]
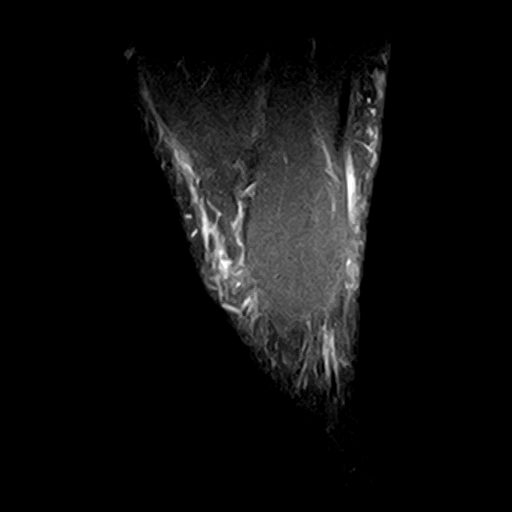
[im 4/19]
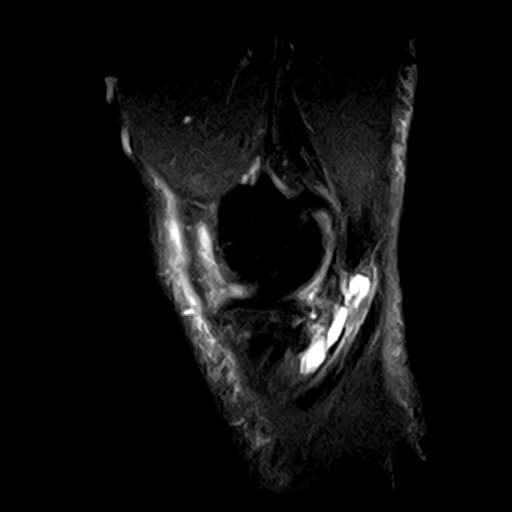
[im 11/19]
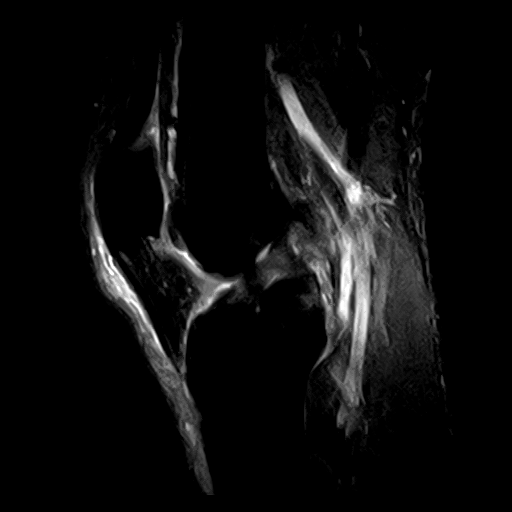
[im 19/19]
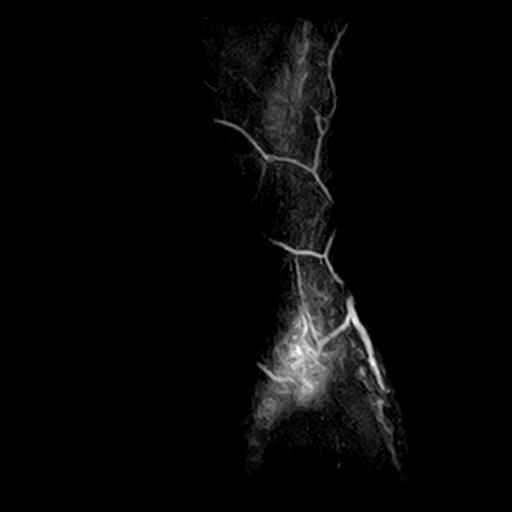

[Series 8: T1 · coronal · left · 3.5mm · 0.41mm/px · 6 of 20 slices shown (2 of 2)]
[im 1/20]
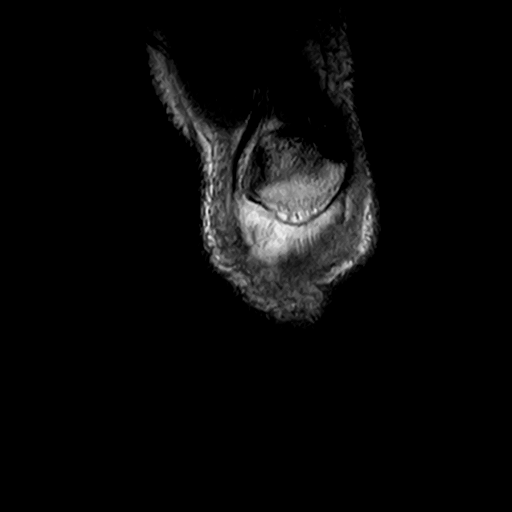
[im 4/20]
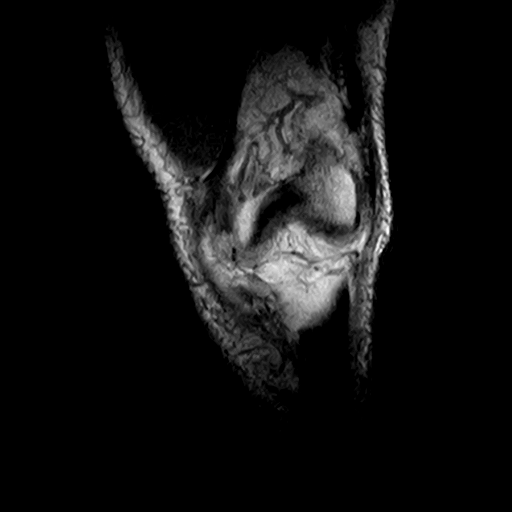
[im 8/20]
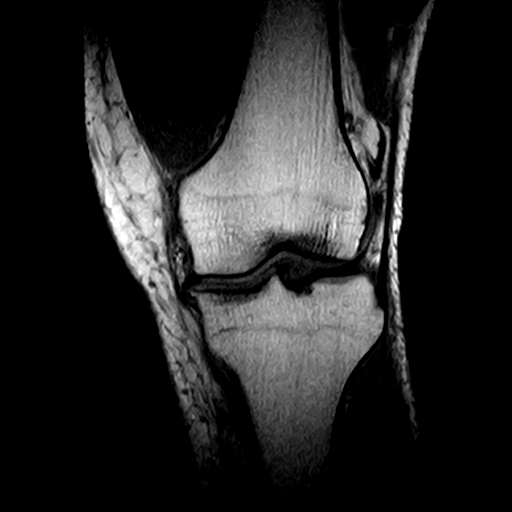
[im 12/20]
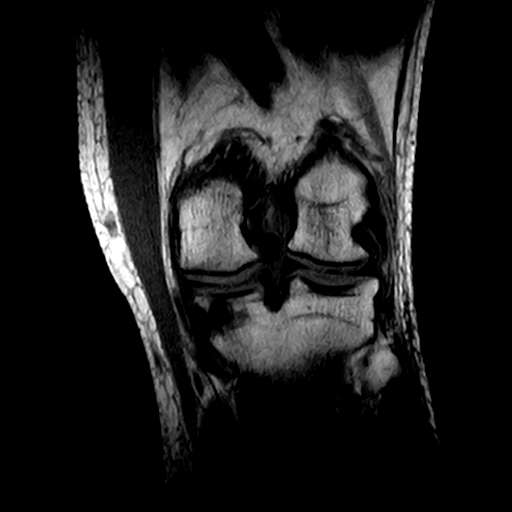
[im 16/20]
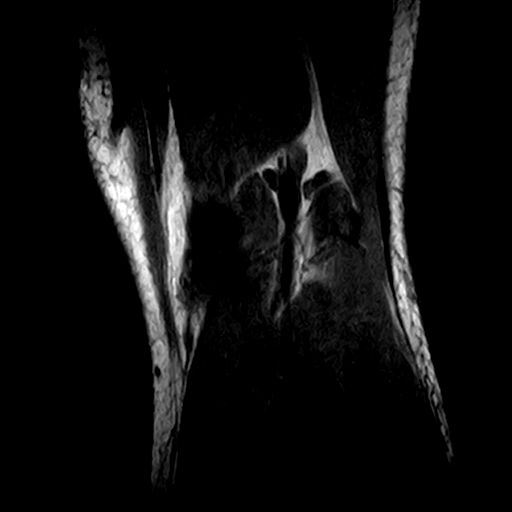
[im 20/20]
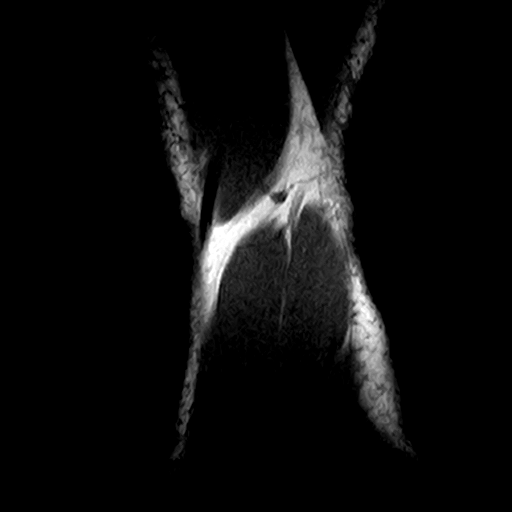

[Series 9: fsat dp sag · sagittal · left · 4.0mm · 0.35mm/px · 3 of 20 slices shown]
[im 4/20]
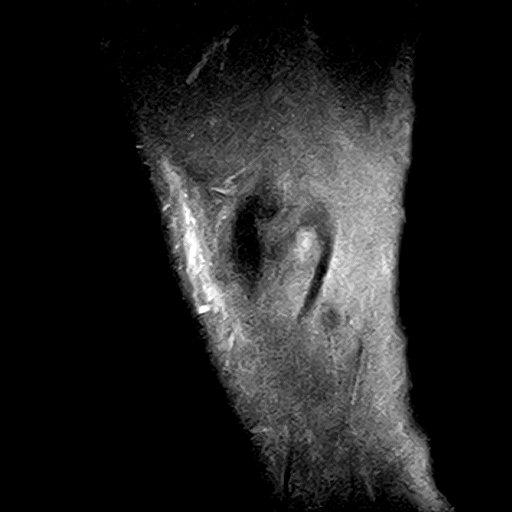
[im 12/20]
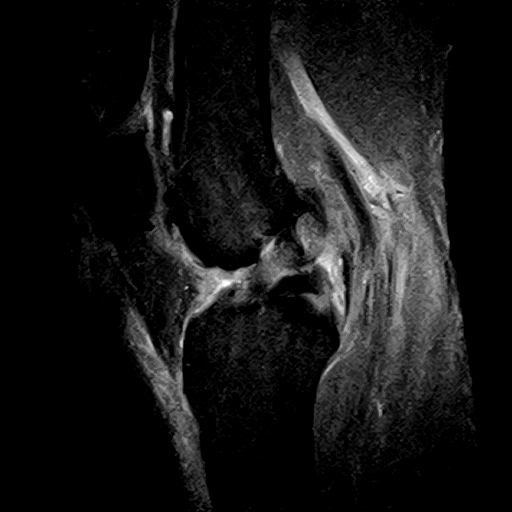
[im 20/20]
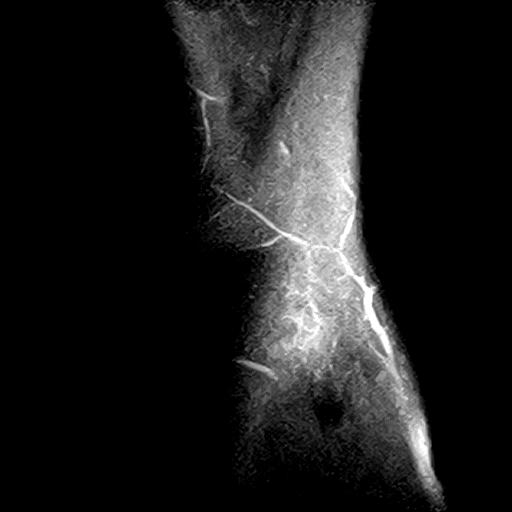

[19 of 40 positions shown; findings below may reference images not displayed]

RESSONÂNCIA MAGNÉTICA DO JOELHO ESQUERDO

TÉCNICA:

Exame realizado em equipamento de ressonância magnética com sequências, ponderações e planos específicos para o segmento de interesse, sem a administração endovenosa do meio de contraste.

RESULTADO:
Artropatia degenerativa tricompartimental do joelho caracterizada por afilamento com irregularidade condral e algumas erosões condrais profundas, além de osteófitos marginais.
Menisco lateral íntegro com morfologia e sinal preservados.
Rotura multiplanar do corno posterior do menisco medial estendendo-se para seu corpo.
Aspecto de rotura parcial do ligamento cruzado anterior que se apresenta afilado e com fibras indefinidas. A sua suficiência pode ser melhor avaliada ao exame físico.
Ligamento cruzado posterior e colaterais íntegros com morfologia e sinal preservados.
Tendão quadríceps e ligamento patelar sem alterações.
Pequena quantidade de líquido articular no joelho.
Cisto de Baker com discreta distensão líquida, sem roturas.
Edema do subcutâneo do joelho anterior.

CONCLUSÃO:
Artropatia degenerativa tricompartimental do joelho com líquido articular.
Rotura multiplanar do corno posterior do menisco medial estendendo-se para seu corpo.
Aspecto de rotura parcial do ligamento cruzado anterior que se apresenta afilado e com fibras indefinidas. A sua suficiência pode ser melhor avaliada ao exame físico.
Cisto de Baker com discreta distensão líquida, sem roturas.
